# Patient Record
Sex: Female | Born: 1985 | Race: Black or African American | Hispanic: No | Marital: Single | Smoking: Never smoker
Health system: Southern US, Community
[De-identification: ages and names within clinical notes are randomized; demographics above are authoritative.]

---
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
## 2007-01-29 ENCOUNTER — Ambulatory Visit (HOSPITAL_COMMUNITY): Admission: RE | Admit: 2007-01-29 | Discharge: 2007-01-29 | Payer: Self-pay | Admitting: Obstetrics & Gynecology

## 2007-03-17 ENCOUNTER — Ambulatory Visit (HOSPITAL_COMMUNITY): Admission: RE | Admit: 2007-03-17 | Discharge: 2007-03-17 | Payer: Self-pay | Admitting: Obstetrics & Gynecology

## 2007-07-12 ENCOUNTER — Ambulatory Visit: Payer: Self-pay | Admitting: *Deleted

## 2007-07-12 ENCOUNTER — Inpatient Hospital Stay (HOSPITAL_COMMUNITY): Admission: AD | Admit: 2007-07-12 | Discharge: 2007-07-12 | Payer: Self-pay | Admitting: Obstetrics & Gynecology

## 2007-07-16 ENCOUNTER — Ambulatory Visit (HOSPITAL_COMMUNITY): Admission: RE | Admit: 2007-07-16 | Discharge: 2007-07-16 | Payer: Self-pay | Admitting: Family Medicine

## 2007-08-07 ENCOUNTER — Inpatient Hospital Stay (HOSPITAL_COMMUNITY): Admission: AD | Admit: 2007-08-07 | Discharge: 2007-08-09 | Payer: Self-pay | Admitting: Obstetrics & Gynecology

## 2007-08-07 ENCOUNTER — Ambulatory Visit: Payer: Self-pay | Admitting: Advanced Practice Midwife

## 2007-12-02 ENCOUNTER — Emergency Department (HOSPITAL_COMMUNITY): Admission: EM | Admit: 2007-12-02 | Discharge: 2007-12-02 | Payer: Self-pay | Admitting: Internal Medicine

## 2008-05-23 ENCOUNTER — Emergency Department (HOSPITAL_COMMUNITY): Admission: EM | Admit: 2008-05-23 | Discharge: 2008-05-23 | Payer: Self-pay | Admitting: Emergency Medicine

## 2009-08-18 IMAGING — US US OB COMP LESS 14 WK
1 series · 14 of 19 positions shown · non-contrast
Comparison: none

OBSTETRICAL ULTRASOUND:
 This ultrasound was performed in The [HOSPITAL], and the [facility] report will be stored to [REDACTED] PACS.

[Series 1: us ob comp less 14 wk · 14 of 19 slices shown]
[im 1/19]
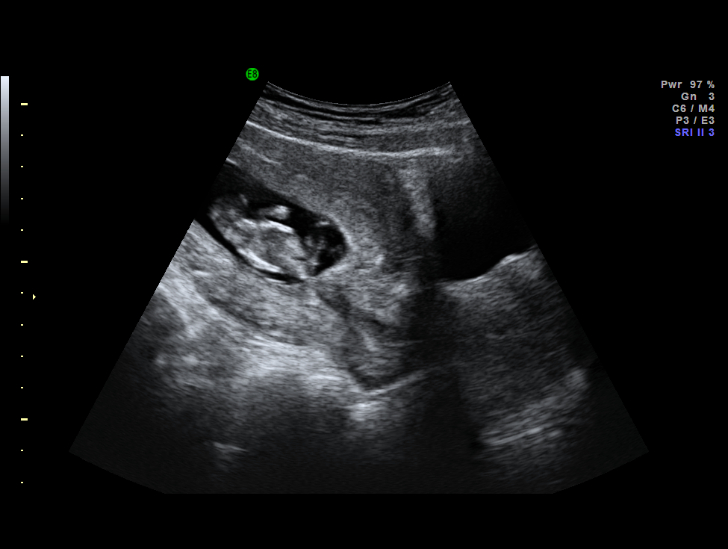
[im 3/19]
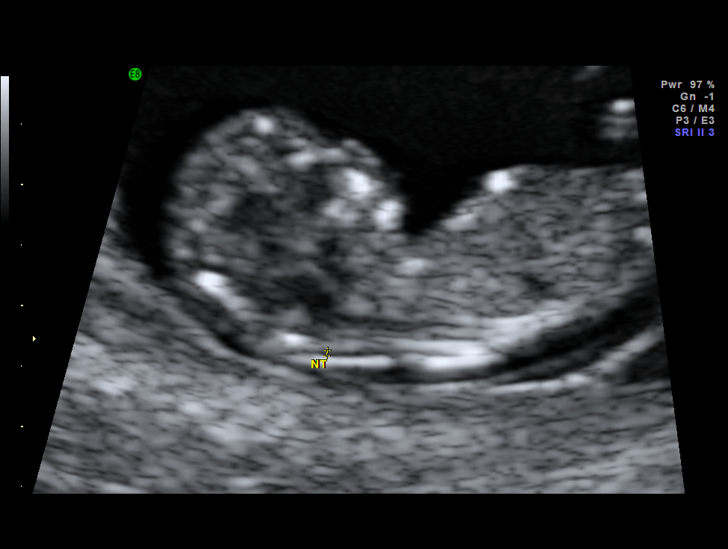
[im 4/19]
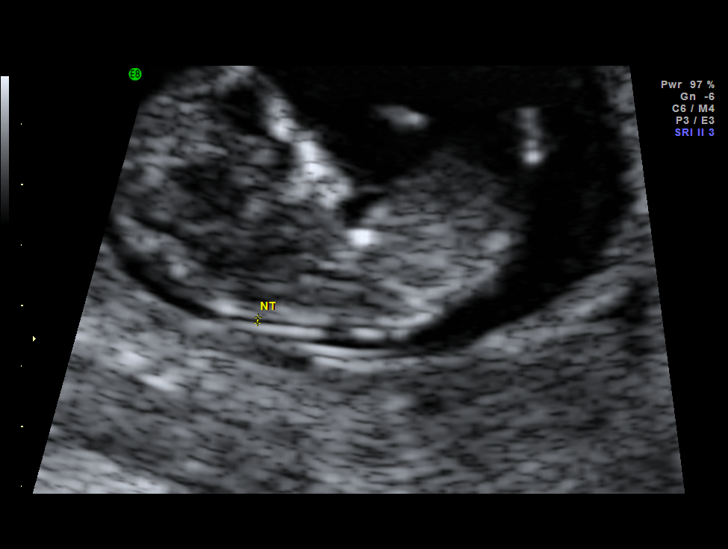
[im 5/19]
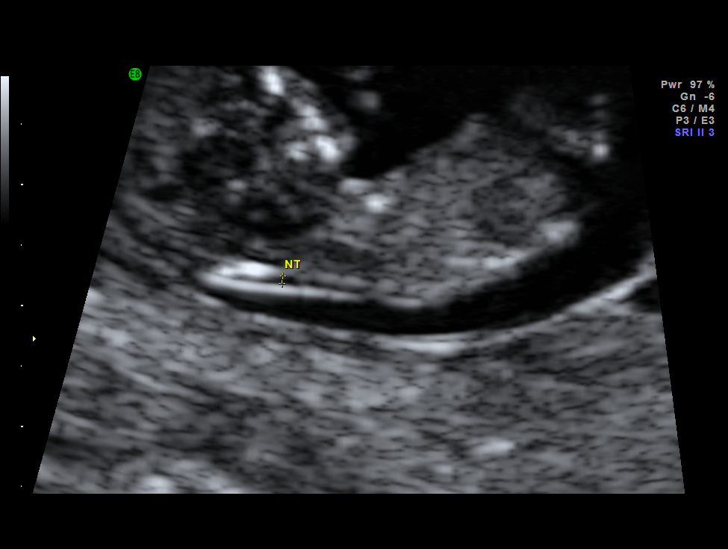
[im 7/19]
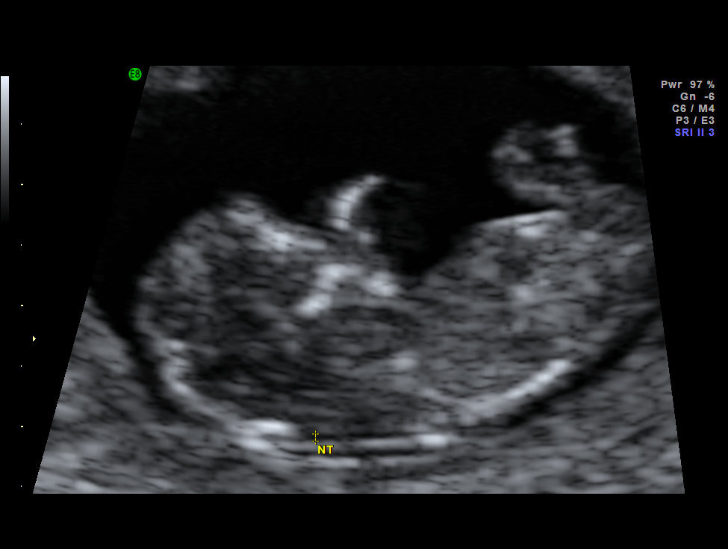
[im 8/19]
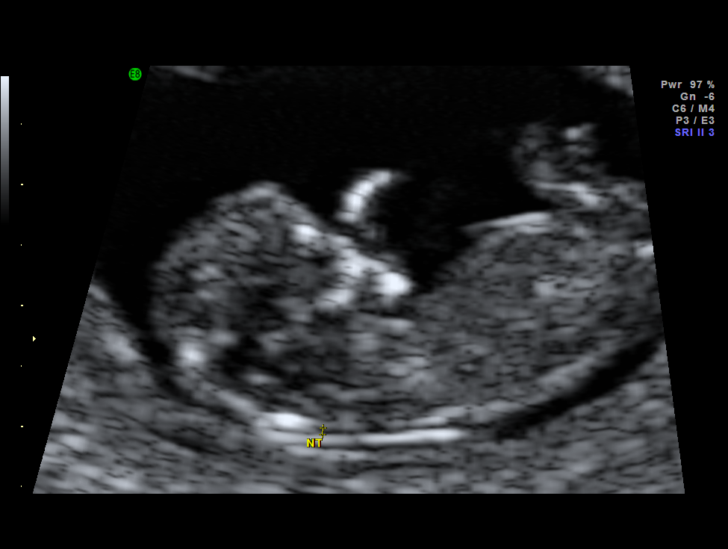
[im 9/19]
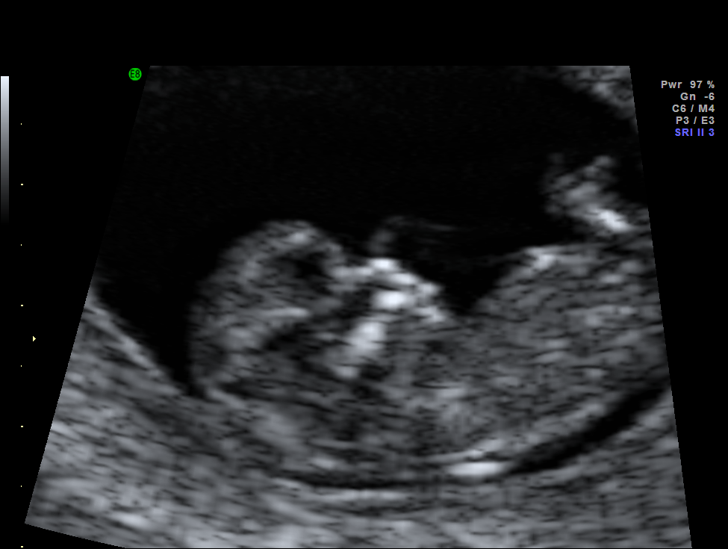
[im 11/19]
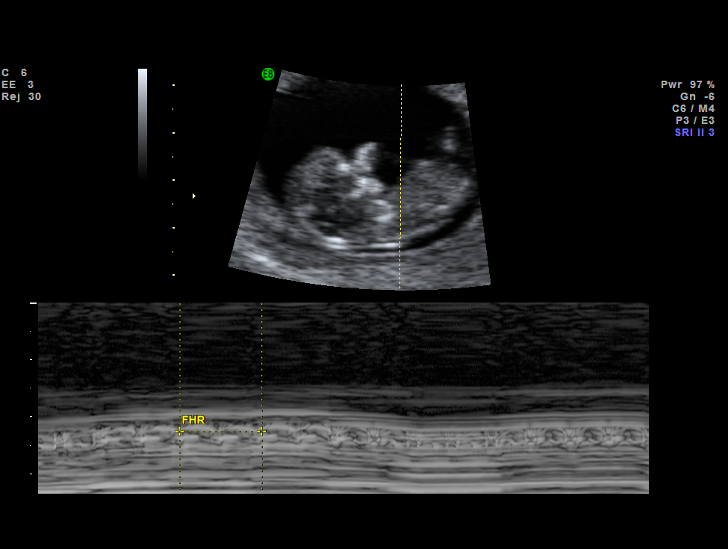
[im 12/19]
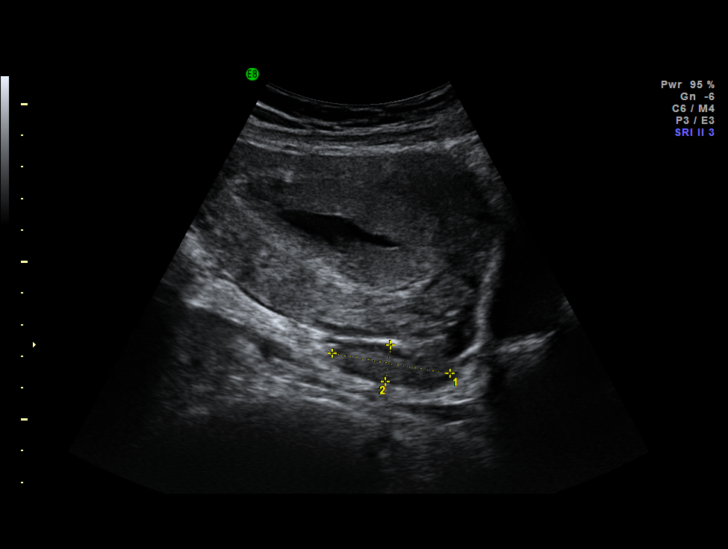
[im 13/19]
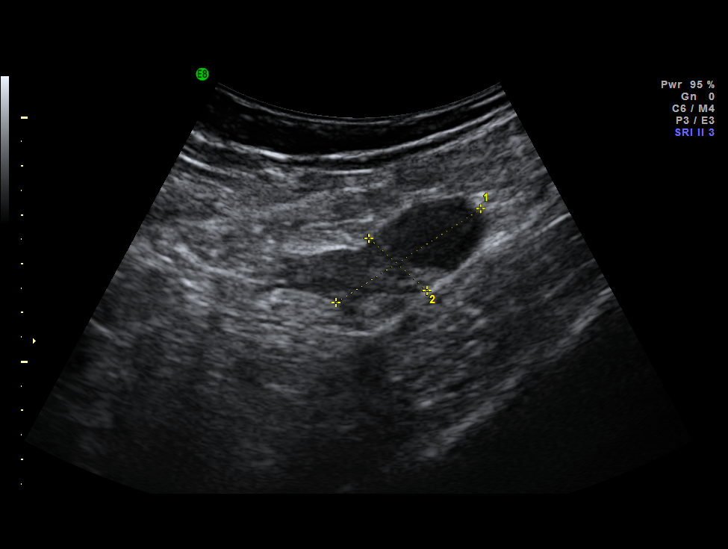
[im 15/19]
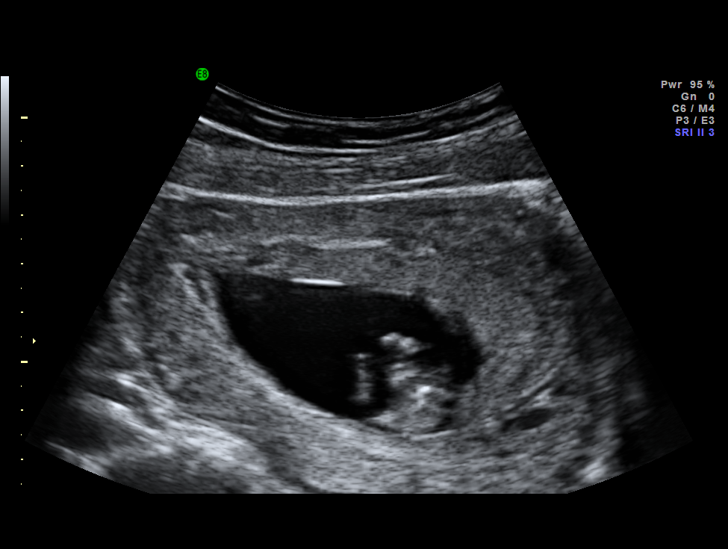
[im 16/19]
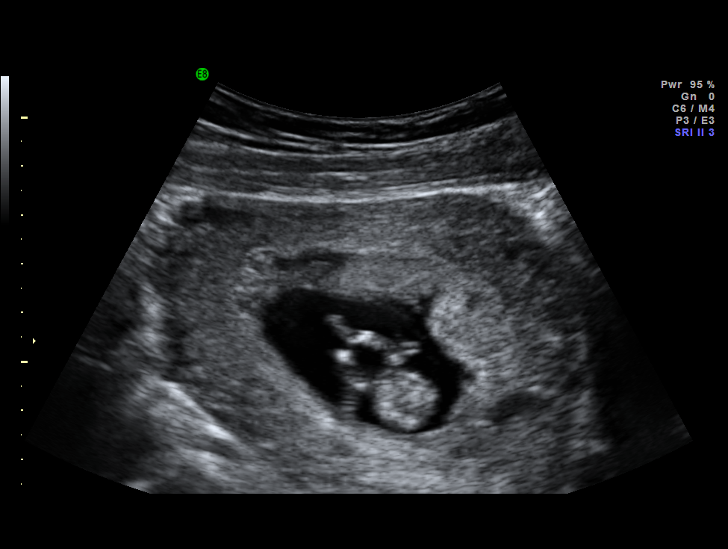
[im 17/19]
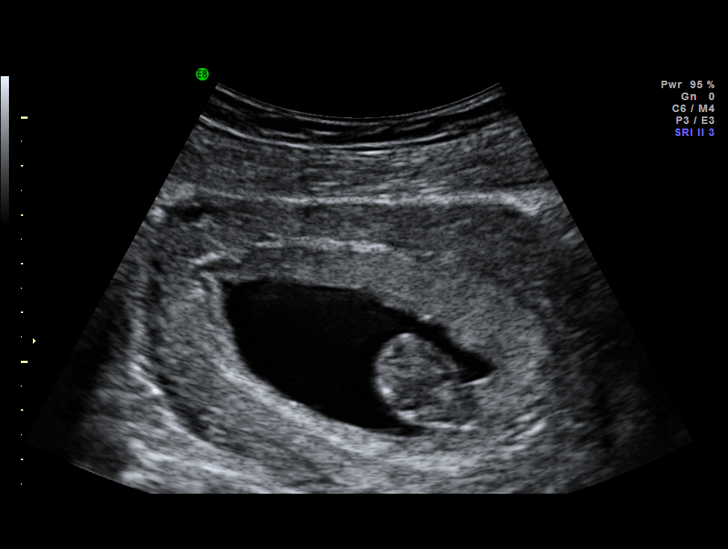
[im 19/19]
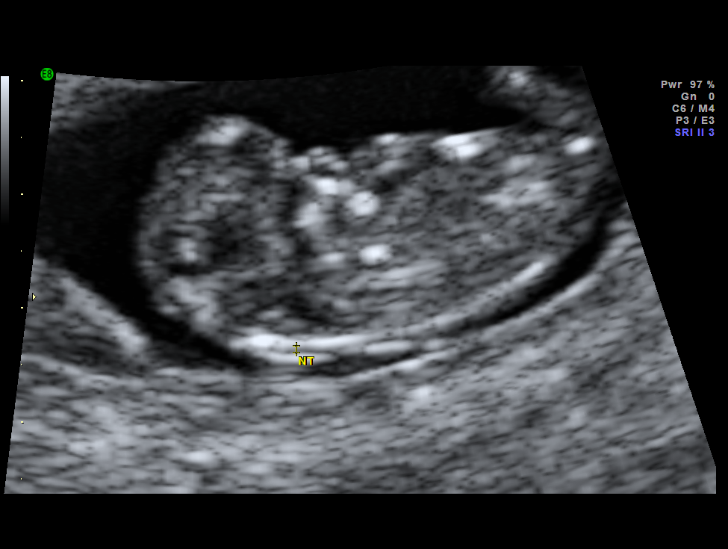

[14 of 19 positions shown; findings below may reference images not displayed]

IMPRESSION: The [facility] report has also been faxed to the ordering physician.

## 2009-10-04 IMAGING — US US OB COMP +14 WK
2 series · 14 of 28 positions shown · non-contrast
Comparison: none

OBSTETRICAL ULTRASOUND:

 This ultrasound exam was performed in the [HOSPITAL] Ultrasound Department.  The OB US report was generated in the [facility] system, and faxed to the ordering physician.  This report is also available in [REDACTED] PACS.

[Series 1: us ob comp +14 wk · 11 of 50 slices shown (1 of 2)]
[im 3/50]
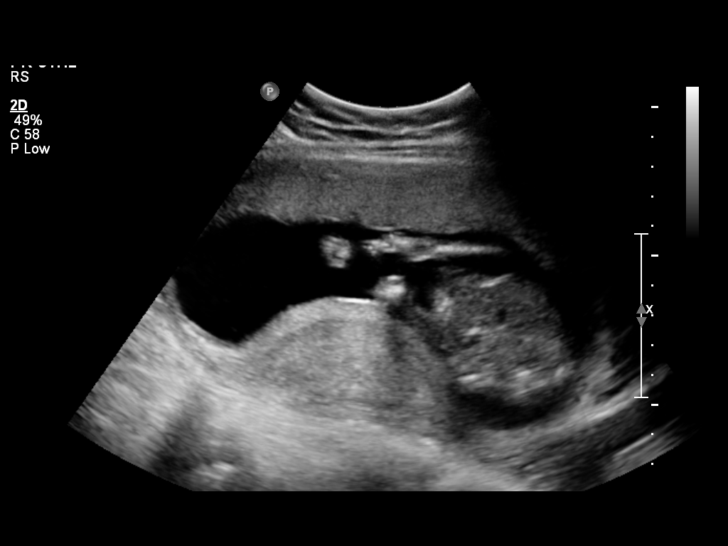
[im 7/50]
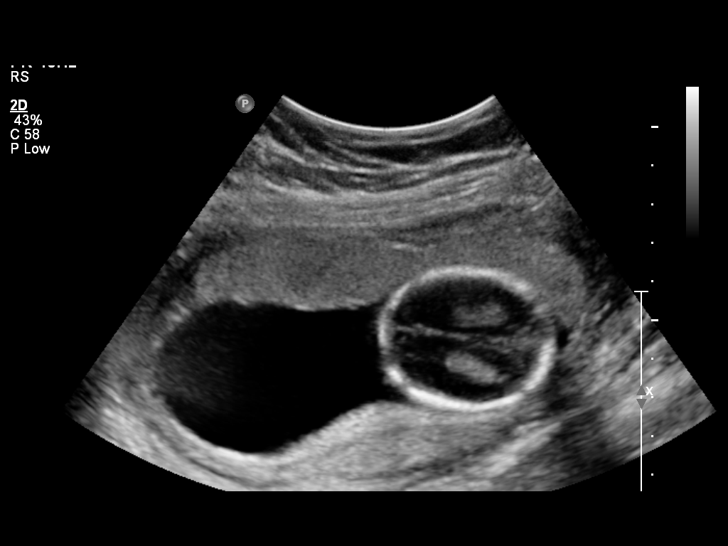
[im 12/50]
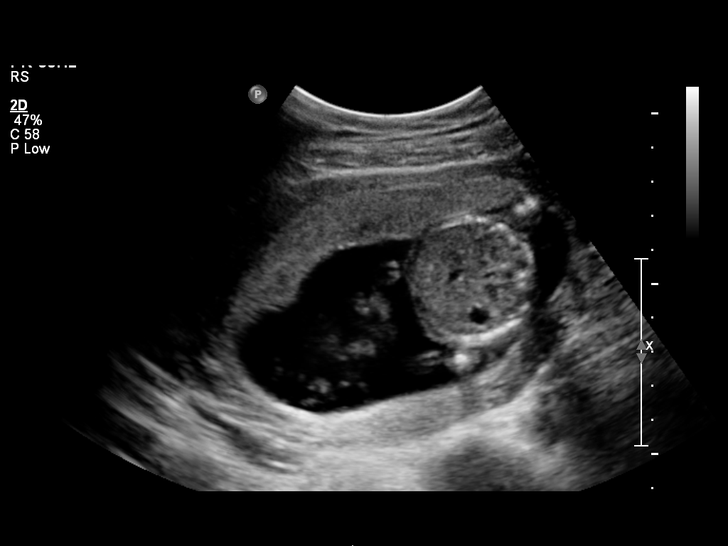
[im 16/50]
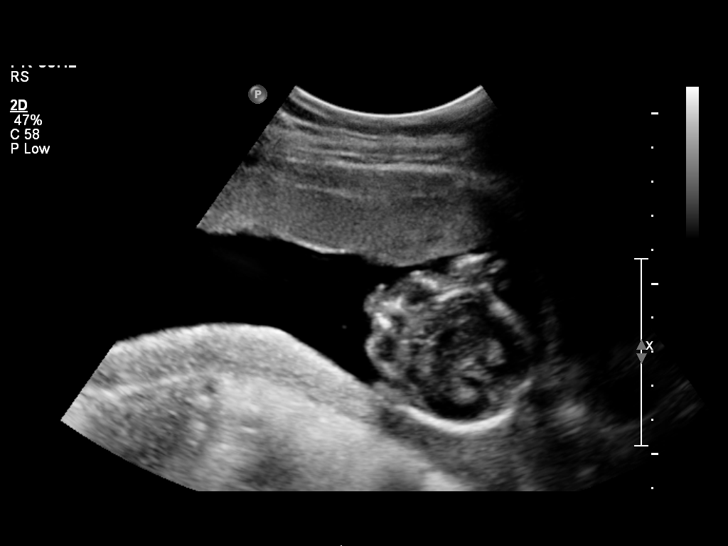
[im 21/50]
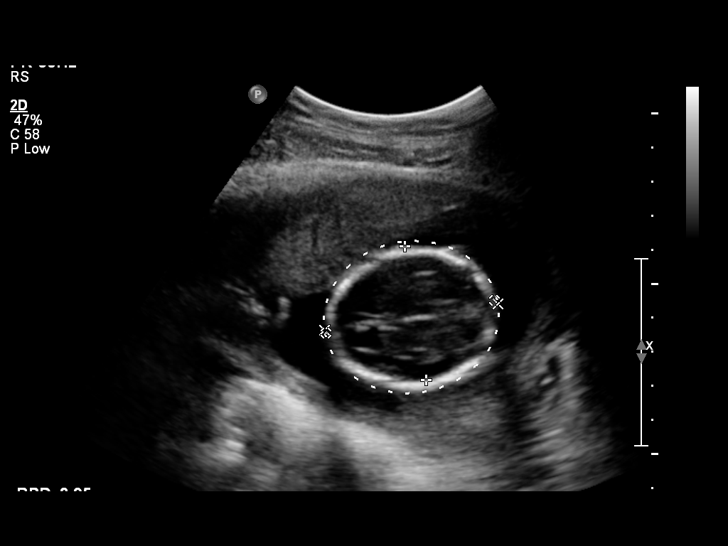
[im 25/50]
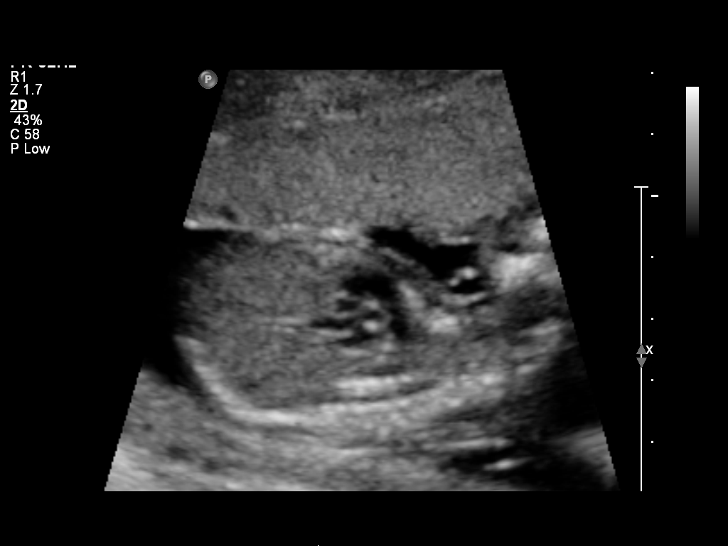
[im 29/50]
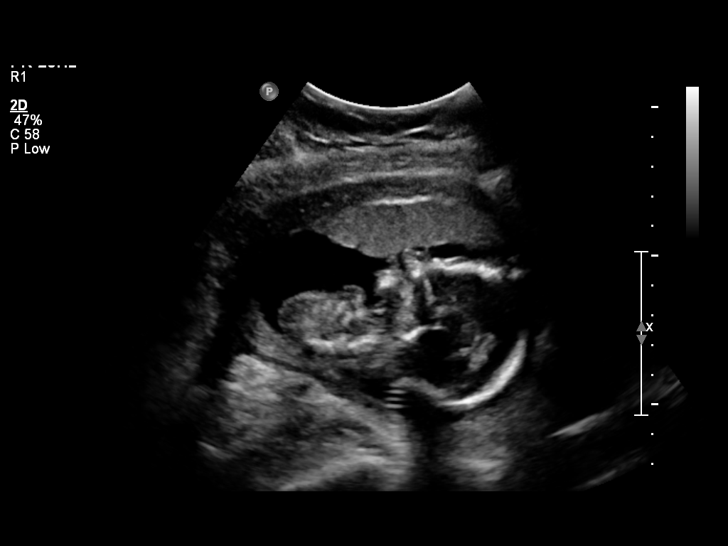
[im 34/50]
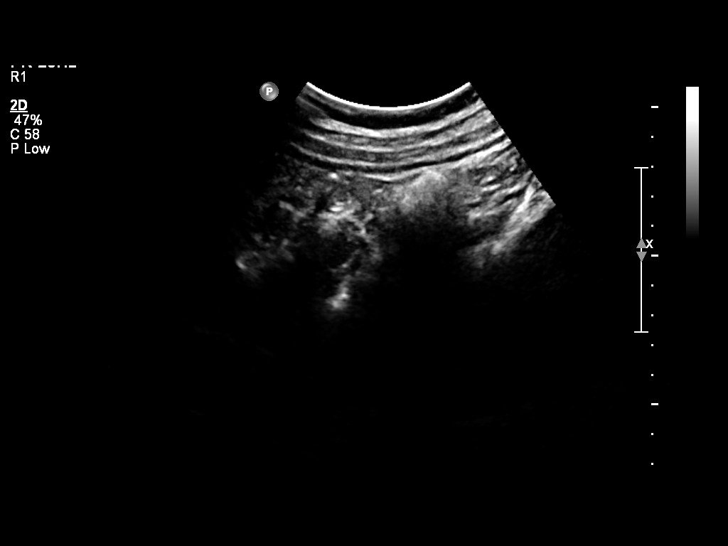
[im 38/50]
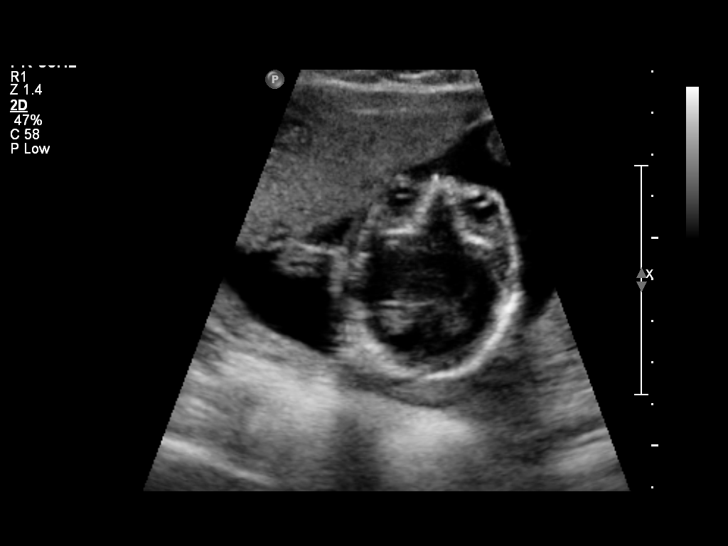
[im 43/50]
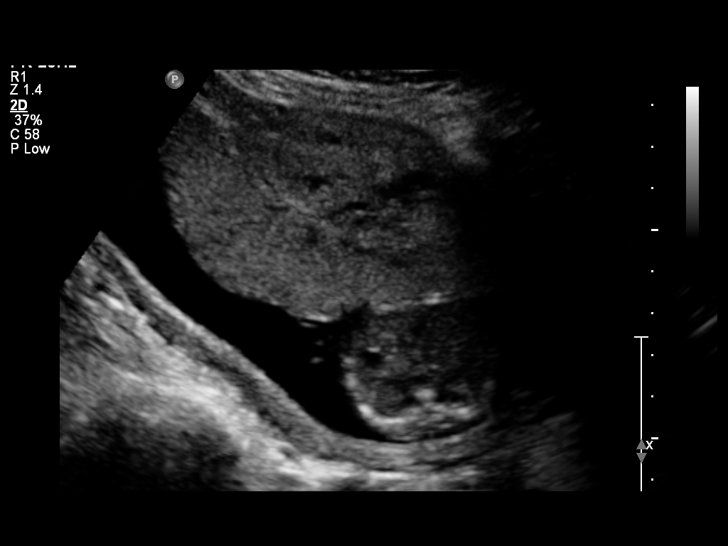
[im 47/50]
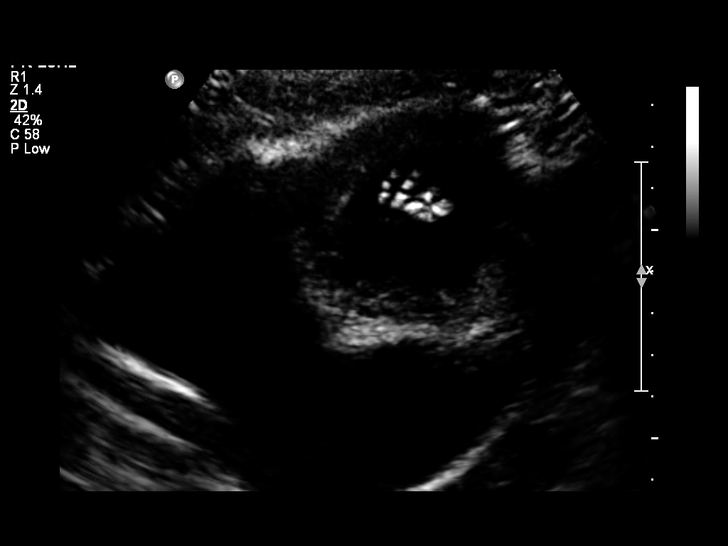

[Series 1: us ob comp +14 wk · 3 of 10 slices shown (2 of 2)]
[im 1/10]
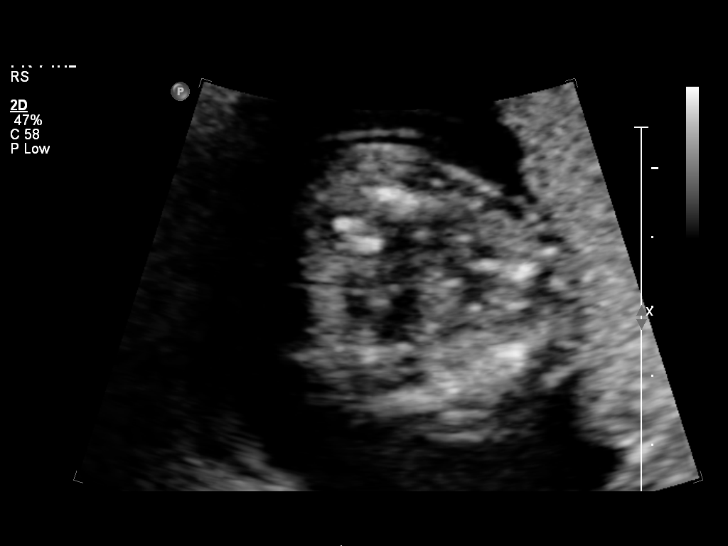
[im 5/10]
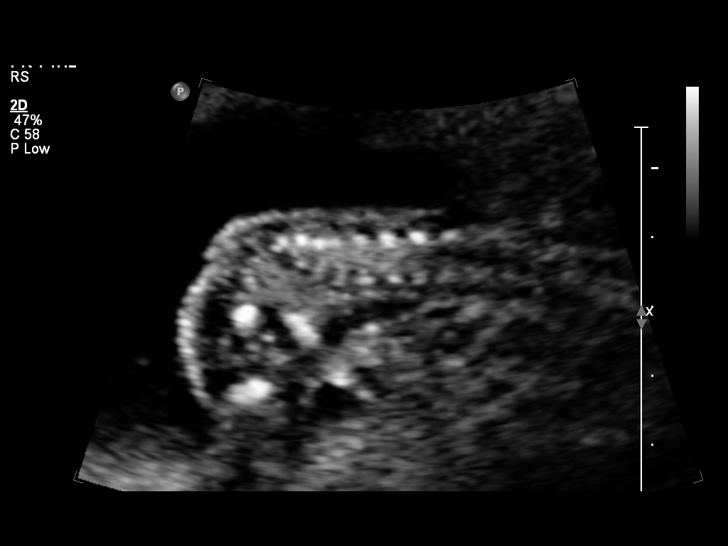
[im 10/10]
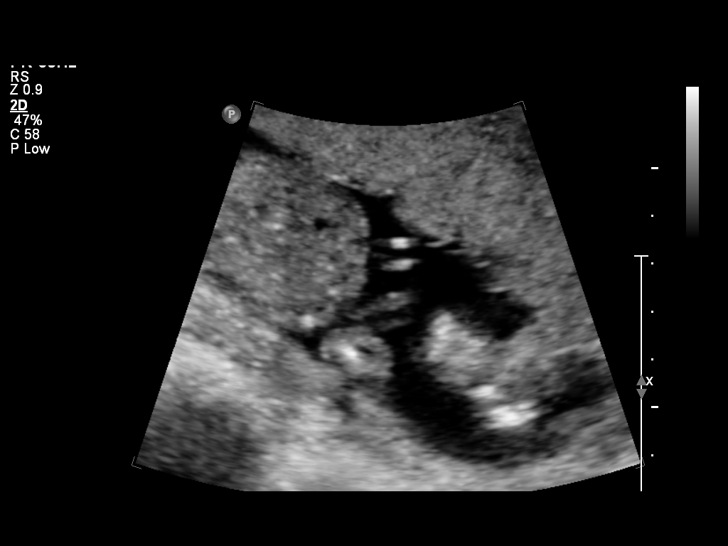

[14 of 28 positions shown; findings below may reference images not displayed]

IMPRESSION: See [facility] report.

## 2010-02-15 ENCOUNTER — Ambulatory Visit
Admission: RE | Admit: 2010-02-15 | Discharge: 2010-02-15 | Payer: Self-pay | Source: Home / Self Care | Attending: Obstetrics & Gynecology | Admitting: Obstetrics & Gynecology

## 2010-03-01 ENCOUNTER — Ambulatory Visit: Admit: 2010-03-01 | Payer: Self-pay | Admitting: Obstetrics & Gynecology

## 2010-03-16 ENCOUNTER — Ambulatory Visit: Admit: 2010-03-16 | Payer: Self-pay | Admitting: Obstetrics and Gynecology

## 2010-03-16 ENCOUNTER — Ambulatory Visit: Payer: Self-pay | Admitting: Obstetrics and Gynecology

## 2010-04-19 ENCOUNTER — Ambulatory Visit: Payer: Self-pay | Admitting: Family Medicine

## 2010-05-03 ENCOUNTER — Ambulatory Visit: Payer: Self-pay | Admitting: Obstetrics & Gynecology

## 2010-06-14 ENCOUNTER — Ambulatory Visit: Payer: Self-pay | Admitting: Family Medicine
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
## 2010-07-23 ENCOUNTER — Emergency Department (HOSPITAL_COMMUNITY)
Admission: EM | Admit: 2010-07-23 | Discharge: 2010-07-24 | Discharge status: Against Medical Advice | Payer: Medicaid Other | Attending: Emergency Medicine | Admitting: Emergency Medicine

## 2010-07-23 DIAGNOSIS — R55 Syncope and collapse: Secondary | ICD-10-CM | POA: Insufficient documentation

## 2010-11-08 LAB — WET PREP, GENITAL
Clue Cells Wet Prep HPF POC: NONE SEEN
Trich, Wet Prep: NONE SEEN
Yeast Wet Prep HPF POC: NONE SEEN

## 2010-11-08 LAB — URINALYSIS, ROUTINE W REFLEX MICROSCOPIC
Bilirubin Urine: NEGATIVE
Nitrite: NEGATIVE
Protein, ur: NEGATIVE
Specific Gravity, Urine: 1.015
Urobilinogen, UA: 0.2

## 2010-11-08 LAB — URINE CULTURE: Colony Count: >=100000

## 2010-11-08 LAB — URINE MICROSCOPIC-ADD ON

## 2010-11-09 LAB — CBC
HCT: 30.5 — ABNORMAL LOW
Hemoglobin: 10.5 — ABNORMAL LOW
MCHC: 34.4
MCV: 86.6
Platelets: 200
RBC: 3.52 — ABNORMAL LOW
RDW: 16.6 — ABNORMAL HIGH
WBC: 8.5

## 2010-11-09 LAB — RPR: RPR Ser Ql: NONREACTIVE

## 2010-11-22 LAB — POCT URINALYSIS DIP (DEVICE)
Bilirubin Urine: NEGATIVE
Glucose, UA: NEGATIVE
Hgb urine dipstick: NEGATIVE
Nitrite: NEGATIVE
Protein, ur: NEGATIVE
Specific Gravity, Urine: 1.02
Urobilinogen, UA: 1
pH: 7.5

## 2010-11-22 LAB — GC/CHLAMYDIA PROBE AMP, GENITAL: Chlamydia, DNA Probe: NEGATIVE

## 2010-11-22 LAB — WET PREP, GENITAL: Yeast Wet Prep HPF POC: NONE SEEN

## 2014-05-21 ENCOUNTER — Emergency Department (HOSPITAL_BASED_OUTPATIENT_CLINIC_OR_DEPARTMENT_OTHER)
Admission: EM | Admit: 2014-05-21 | Discharge: 2014-05-22 | Disposition: A | Discharge status: Home / Self Care | Payer: Medicaid Other | Attending: Emergency Medicine | Admitting: Emergency Medicine

## 2014-05-21 ENCOUNTER — Encounter (HOSPITAL_BASED_OUTPATIENT_CLINIC_OR_DEPARTMENT_OTHER): Payer: Self-pay | Admitting: *Deleted

## 2014-05-21 DIAGNOSIS — B9689 Other specified bacterial agents as the cause of diseases classified elsewhere: Secondary | ICD-10-CM

## 2014-05-21 DIAGNOSIS — K5904 Chronic idiopathic constipation: Secondary | ICD-10-CM

## 2014-05-21 DIAGNOSIS — K5909 Other constipation: Secondary | ICD-10-CM | POA: Insufficient documentation

## 2014-05-21 DIAGNOSIS — Z3202 Encounter for pregnancy test, result negative: Secondary | ICD-10-CM | POA: Insufficient documentation

## 2014-05-21 DIAGNOSIS — N760 Acute vaginitis: Secondary | ICD-10-CM | POA: Insufficient documentation

## 2014-05-21 LAB — CBG MONITORING, ED: Glucose-Capillary: 109 mg/dL — ABNORMAL HIGH (ref 70–99)

## 2014-05-21 NOTE — ED Notes (Signed)
Pt c/o ;URi symptoms and increased urination

## 2014-05-21 NOTE — ED Provider Notes (Signed)
CSN: [id number]     Arrival date & time 05/21/14  2334 History  This chart was scribed for Delena Casebeer, MD by Elveria Risingimelie Horne, ED scribe.  This patient was seen in room MH06/MH06 and the patient's care was started at 11:45 PM.   Chief Complaint  Patient presents with  . URI  . Urinary Frequency   Patient is a 29 y.o. female presenting with dysuria. The history is provided by the patient. No language interpreter was used.  Dysuria Pain quality:  Aching Pain severity:  Moderate Onset quality:  Gradual Timing:  Constant Progression:  Unchanged Chronicity:  New Recent urinary tract infections: no   Relieved by:  Nothing Worsened by:  Nothing tried Ineffective treatments:  None tried Urinary symptoms: no foul-smelling urine   Associated symptoms: vaginal discharge   Associated symptoms: no fever, no nausea and no vomiting   Risk factors: no urinary catheter    HPI Comments: Tina Frazier is a 29 y.o. female who presents to the Emergency Department complaining of urinary frequency. Patient reports burning sensation after urinating and abdominal pain. Patient reports decreased appetite and cold symptoms recently.  Patient reports fever a few days ago; she afebrile in ED.  LMP 05/01/2014  History reviewed. No pertinent past medical history. History reviewed. No pertinent past surgical history. History reviewed. No pertinent family history. History  Substance Use Topics  . Smoking status: Never Smoker   . Smokeless tobacco: Not on file  . Alcohol Use: No   OB History    No data available     Review of Systems  Constitutional: Negative for fever and chills.  Respiratory: Negative for shortness of breath and wheezing.   Gastrointestinal: Positive for constipation. Negative for nausea, vomiting and diarrhea.  Genitourinary: Positive for dysuria, frequency and vaginal discharge.  All other systems reviewed and are negative.   Allergies  Review of patient's allergies indicates no  known allergies.  Home Medications   Prior to Admission medications   Not on File   Triage Vitals: BP 133/86 mmHg  Pulse 92  Temp(Src) 98.2 F (36.8 C) (Oral)  Ht 5\' 9"  (1.753 m)  Wt 185 lb (83.915 kg)  BMI 27.31 kg/m2  SpO2 100%  LMP 05/01/2014 Physical Exam  Constitutional: She is oriented to person, place, and time. She appears well-developed and well-nourished. No distress.  HENT:  Head: Normocephalic and atraumatic.  Mouth/Throat: Oropharynx is clear and moist. No oropharyngeal exudate.  Eyes: EOM are normal. Pupils are equal, round, and reactive to light.  Neck: Normal range of motion. Neck supple. No tracheal deviation present.  Cardiovascular: Normal rate, regular rhythm and normal heart sounds.   Pulmonary/Chest: Effort normal and breath sounds normal. No respiratory distress. She has no wheezes. She has no rales. She exhibits no tenderness.  Abdominal: Soft. Bowel sounds are normal. She exhibits no mass. There is no tenderness. There is no rebound and no guarding.  Hard stool throughout colon.   Genitourinary: Vaginal discharge found.  Scant white discharge, chaperone present no CMT or adnexal tenderness  Musculoskeletal: Normal range of motion.  Neurological: She is alert and oriented to person, place, and time. She has normal reflexes.  Skin: Skin is warm and dry.  Psychiatric: She has a normal mood and affect. Her behavior is normal.  Nursing note and vitals reviewed.   ED Course  Procedures (including critical care time)  COORDINATION OF CARE: 11:54 PM- Awaiting urine specimen. Discussed treatment plan with patient at bedside and patient agreed to  plan.   Labs Review Labs Reviewed  CBG MONITORING, ED - Abnormal; Notable for the following:    Glucose-Capillary 109 (*)    All other components within normal limits  URINALYSIS, ROUTINE W REFLEX MICROSCOPIC  PREGNANCY, URINE    Imaging Review No results found.   EKG Interpretation None      MDM    Final diagnoses:  None    Will treat for BV and constipation with miralax.  Close follow up with your PMD  I personally performed the services described in this documentation, which was scribed in my presence. The recorded information has been reviewed and is accurate.    Cy Blamer, MD 05/22/14 [phone]

## 2014-05-22 LAB — URINALYSIS, ROUTINE W REFLEX MICROSCOPIC
BILIRUBIN URINE: NEGATIVE
GLUCOSE, UA: NEGATIVE mg/dL
Hgb urine dipstick: NEGATIVE
KETONES UR: NEGATIVE mg/dL
LEUKOCYTES UA: NEGATIVE
Nitrite: NEGATIVE
PH: 7 (ref 5.0–8.0)
PROTEIN: NEGATIVE mg/dL
SPECIFIC GRAVITY, URINE: 1.025 (ref 1.005–1.030)
Urobilinogen, UA: 1 mg/dL (ref 0.0–1.0)

## 2014-05-22 LAB — PREGNANCY, URINE: PREG TEST UR: NEGATIVE

## 2014-05-22 LAB — WET PREP, GENITAL
TRICH WET PREP: NONE SEEN
Yeast Wet Prep HPF POC: NONE SEEN

## 2014-05-22 MED ORDER — METRONIDAZOLE 500 MG PO TABS: 500.0000 mg | ORAL_TABLET | Freq: Two times a day (BID) | ORAL | Status: AC

## 2014-05-22 MED ORDER — METRONIDAZOLE 500 MG PO TABS
500.0000 mg | ORAL_TABLET | Freq: Once | ORAL | Status: AC
  Administered 2014-05-22: 500 mg via ORAL
  Filled 2014-05-22: qty 1

## 2014-05-22 NOTE — Discharge Instructions (Signed)
Bacterial Vaginosis Bacterial vaginosis is a vaginal infection that occurs when the normal balance of bacteria in the vagina is disrupted. It results from an overgrowth of certain bacteria. This is the most common vaginal infection in women of childbearing age. Treatment is important to prevent complications, especially in pregnant women, as it can cause a premature delivery. CAUSES  Bacterial vaginosis is caused by an increase in harmful bacteria that are normally present in smaller amounts in the vagina. Several different kinds of bacteria can cause bacterial vaginosis. However, the reason that the condition develops is not fully understood. RISK FACTORS Certain activities or behaviors can put you at an increased risk of developing bacterial vaginosis, including:  Having a new sex partner or multiple sex partners.  Douching.  Using an intrauterine device (IUD) for contraception. Women do not get bacterial vaginosis from toilet seats, bedding, swimming pools, or contact with objects around them. SIGNS AND SYMPTOMS  Some women with bacterial vaginosis have no signs or symptoms. Common symptoms include:  Grey vaginal discharge.  A fishlike odor with discharge, especially after sexual intercourse.  Itching or burning of the vagina and vulva.  Burning or pain with urination. DIAGNOSIS  Your health care provider will take a medical history and examine the vagina for signs of bacterial vaginosis. A sample of vaginal fluid may be taken. Your health care provider will look at this sample under a microscope to check for bacteria and abnormal cells. A vaginal pH test may also be done.  TREATMENT  Bacterial vaginosis may be treated with antibiotic medicines. These may be given in the form of a pill or a vaginal cream. A second round of antibiotics may be prescribed if the condition comes back after treatment.  HOME CARE INSTRUCTIONS   Only take over-the-counter or prescription medicines as  directed by your health care provider.  If antibiotic medicine was prescribed, take it as directed. Make sure you finish it even if you start to feel better.  Do not have sex until treatment is completed.  Tell all sexual partners that you have a vaginal infection. They should see their health care provider and be treated if they have problems, such as a mild rash or itching.  Practice safe sex by using condoms and only having one sex partner. SEEK MEDICAL CARE IF:   Your symptoms are not improving after 3 days of treatment.  You have increased discharge or pain.  You have a fever. MAKE SURE YOU:   Understand these instructions.  Will watch your condition.  Will get help right away if you are not doing well or get worse. FOR MORE INFORMATION  Centers for Disease Control and Prevention, Division of STD Prevention: www.cdc.gov/std American Sexual Health Association (ASHA): www.ashastd.org  Document Released: 01/29/2005 Document Revised: 11/19/2012 Document Reviewed: 09/10/2012 Exit[employer] Patient Information 2015 [employer]. This information is not intended to replace advice given to you by your health care provider. Make sure you discuss any questions you have with your health care provider.  

## 2014-05-24 LAB — GC/CHLAMYDIA PROBE AMP (~~LOC~~) NOT AT ARMC
Chlamydia: NEGATIVE
Neisseria Gonorrhea: NEGATIVE

## 2014-09-30 ENCOUNTER — Emergency Department (INDEPENDENT_AMBULATORY_CARE_PROVIDER_SITE_OTHER)
Admission: EM | Admit: 2014-09-30 | Discharge: 2014-09-30 | Disposition: A | Discharge status: Home / Self Care | Payer: Self-pay | Source: Home / Self Care | Attending: Family Medicine | Admitting: Family Medicine

## 2014-09-30 ENCOUNTER — Encounter (HOSPITAL_COMMUNITY): Payer: Self-pay | Admitting: Emergency Medicine

## 2014-09-30 DIAGNOSIS — R42 Dizziness and giddiness: Secondary | ICD-10-CM

## 2014-09-30 DIAGNOSIS — R5383 Other fatigue: Secondary | ICD-10-CM

## 2014-09-30 DIAGNOSIS — R531 Weakness: Secondary | ICD-10-CM

## 2014-09-30 LAB — POCT I-STAT, CHEM 8
BUN: 11 mg/dL (ref 6–20)
Calcium, Ion: 1.22 mmol/L (ref 1.12–1.23)
Chloride: 102 mmol/L (ref 101–111)
Creatinine, Ser: 0.7 mg/dL (ref 0.44–1.00)
Glucose, Bld: 84 mg/dL (ref 65–99)
HEMATOCRIT: 37 % (ref 36.0–46.0)
HEMOGLOBIN: 12.6 g/dL (ref 12.0–15.0)
POTASSIUM: 3.9 mmol/L (ref 3.5–5.1)
Sodium: 139 mmol/L (ref 135–145)
TCO2: 26 mmol/L (ref 0–100)

## 2014-09-30 LAB — POCT PREGNANCY, URINE: PREG TEST UR: NEGATIVE

## 2014-09-30 LAB — POCT URINALYSIS DIP (DEVICE)
BILIRUBIN URINE: NEGATIVE
GLUCOSE, UA: NEGATIVE mg/dL
Ketones, ur: NEGATIVE mg/dL
Leukocytes, UA: NEGATIVE
NITRITE: NEGATIVE
Protein, ur: NEGATIVE mg/dL
Specific Gravity, Urine: 1.025 (ref 1.005–1.030)
Urobilinogen, UA: 0.2 mg/dL (ref 0.0–1.0)
pH: 5.5 (ref 5.0–8.0)

## 2014-09-30 NOTE — ED Provider Notes (Signed)
CSN: [id number]     Arrival date & time 09/30/14  1310 History   First MD Initiated Contact with Patient 09/30/14 1452     Chief Complaint  Patient presents with  . Anemia   (Consider location/radiation/quality/duration/timing/severity/associated sxs/prior Treatment) HPI Comments: 29 year old female is complaining of a three-week history of intermittent dizziness, generalized weakness, feeling tired and headache. She is under the assumption that she may have anemia based on an MRI of the lower back. This MRI interpretation reveals that there are changes in bone marrow echogenicity and one reason that may cause this appearance is anemia. The patient denies any known bleeding.  Patient is a 29 y.o. female presenting with weakness and dizziness. The history is provided by the patient.  Weakness This is a new problem. The current episode started more than 1 week ago. The problem occurs constantly. The problem has not changed since onset.Associated symptoms include headaches. Pertinent negatives include no chest pain, no abdominal pain and no shortness of breath. Nothing aggravates the symptoms. Nothing relieves the symptoms. She has tried nothing for the symptoms. The treatment provided no relief.  Dizziness Quality:  Imbalance and lightheadedness Severity:  Mild Onset quality:  Gradual Duration:  3 weeks Timing:  Intermittent Progression:  Waxing and waning Context: bending over   Context: not with ear pain, not with loss of consciousness, not with medication, not with physical activity and not when urinating   Relieved by:  Being still Worsened by:  Movement Ineffective treatments:  None tried Associated symptoms: headaches and weakness   Associated symptoms: no chest pain, no hearing loss, no palpitations and no shortness of breath   Risk factors: anemia     History reviewed. No pertinent past medical history. History reviewed. No pertinent past surgical history. History reviewed. No  pertinent family history. Social History  Substance Use Topics  . Smoking status: Never Smoker   . Smokeless tobacco: None  . Alcohol Use: No   OB History    No data available     Review of Systems  Constitutional: Positive for activity change and fatigue. Negative for fever and chills.  HENT: Negative for congestion, ear discharge, ear pain, hearing loss, postnasal drip, rhinorrhea and sore throat.   Eyes: Negative.   Respiratory: Negative for cough, chest tightness and shortness of breath.   Cardiovascular: Negative for chest pain, palpitations and leg swelling.  Gastrointestinal: Negative.  Negative for abdominal pain.  Genitourinary: Positive for urgency. Negative for dysuria, frequency, flank pain, vaginal bleeding, vaginal discharge, vaginal pain and pelvic pain.  Musculoskeletal: Negative.   Neurological: Positive for dizziness, weakness and headaches.  Psychiatric/Behavioral: Negative.     Allergies  Review of patient's allergies indicates no known allergies.  Home Medications   Prior to Admission medications   Medication Sig Start Date End Date Taking? Authorizing Provider  metroNIDAZOLE (FLAGYL) 500 MG tablet Take 1 tablet (500 mg total) by mouth 2 (two) times daily. One po bid x 7 days 05/22/14   April Palumbo, MD   BP 108/71 mmHg  Pulse 73  Temp(Src) 98.4 F (36.9 C) (Oral)  Resp 18  SpO2 100% Physical Exam  Constitutional: She is oriented to person, place, and time. She appears well-developed and well-nourished. No distress.  HENT:  Mouth/Throat: Oropharynx is clear and moist. No oropharyngeal exudate.  Bilateral TMs are normal  Eyes: Conjunctivae and EOM are normal. Pupils are equal, round, and reactive to light.  Neck: Normal range of motion. Neck supple.  Cardiovascular: Normal rate, regular  rhythm and normal heart sounds.   Pulmonary/Chest: Effort normal and breath sounds normal. No respiratory distress.  Abdominal: Soft. There is no tenderness. There is  no guarding.  Musculoskeletal: She exhibits no edema or tenderness.  Lymphadenopathy:    She has no cervical adenopathy.  Neurological: She is alert and oriented to person, place, and time. She exhibits normal muscle tone.  Skin: Skin is warm and dry. No rash noted.  Psychiatric: She has a normal mood and affect.  Nursing note and vitals reviewed.   ED Course  Procedures (including critical care time) Labs Review Labs Reviewed  POCT URINALYSIS DIP (DEVICE) - Abnormal; Notable for the following:    Hgb urine dipstick TRACE (*)    All other components within normal limits  POCT I-STAT, CHEM 8  POCT PREGNANCY, URINE    Imaging Review No results found.  Results for orders placed or performed during the hospital encounter of 09/30/14  I-STAT, chem 8  Result Value Ref Range   Sodium 139 135 - 145 mmol/L   Potassium 3.9 3.5 - 5.1 mmol/L   Chloride 102 101 - 111 mmol/L   BUN 11 6 - 20 mg/dL   Creatinine, Ser 1.61 0.44 - 1.00 mg/dL   Glucose, Bld 84 65 - 99 mg/dL   Calcium, Ion 0.96 0.45 - 1.23 mmol/L   TCO2 26 0 - 100 mmol/L   Hemoglobin 12.6 12.0 - 15.0 g/dL   HCT 40.9 81.1 - 91.4 %  POCT urinalysis dip (device)  Result Value Ref Range   Glucose, UA NEGATIVE NEGATIVE mg/dL   Bilirubin Urine NEGATIVE NEGATIVE   Ketones, ur NEGATIVE NEGATIVE mg/dL   Specific Gravity, Urine 1.025 1.005 - 1.030   Hgb urine dipstick TRACE (A) NEGATIVE   pH 5.5 5.0 - 8.0   Protein, ur NEGATIVE NEGATIVE mg/dL   Urobilinogen, UA 0.2 0.0 - 1.0 mg/dL   Nitrite NEGATIVE NEGATIVE   Leukocytes, UA NEGATIVE NEGATIVE  Pregnancy, urine POC  Result Value Ref Range   Preg Test, Ur NEGATIVE NEGATIVE    MDM   1. Other fatigue   2. Dizziness   3. Weakness     It is uncertain as to what the cause of beer a weakness and fatigue is. Your lab work is normal. There is no evidence of anemia. Urine specimen is normal. There were no findings on her physical exam to suggest infection or other abnormalities.  Recommended to locate a PCP as soon as possible for evaluation. Look on page one for phone numbers for assisting in obtaining a PCP. For any new symptoms problems or worsening coated emergency department.     Hayden Rasmussen, NP 09/30/14 [phone]

## 2014-09-30 NOTE — ED Notes (Signed)
C/o anemia  States she is dizzy, lightheaded and fatigue  No tx tried  Went to ortho and was dx with anemia

## 2014-09-30 NOTE — Discharge Instructions (Signed)
Dizziness °Dizziness is a common problem. It is a feeling of unsteadiness or light-headedness. You may feel like you are about to faint. Dizziness can lead to injury if you stumble or fall. A person of any age group can suffer from dizziness, but dizziness is more common in older adults. °CAUSES  °Dizziness can be caused by many different things, including: °· Middle ear problems. °· Standing for too long. °· Infections. °· An allergic reaction. °· Aging. °· An emotional response to something, such as the sight of blood. °· Side effects of medicines. °· Tiredness. °· Problems with circulation or blood pressure. °· Excessive use of alcohol or medicines, or illegal drug use. °· Breathing too fast (hyperventilation). °· An irregular heart rhythm (arrhythmia). °· A low red blood cell count (anemia). °· Pregnancy. °· Vomiting, diarrhea, fever, or other illnesses that cause body fluid loss (dehydration). °· Diseases or conditions such as Parkinson's disease, high blood pressure (hypertension), diabetes, and thyroid problems. °· Exposure to extreme heat. °DIAGNOSIS  °Your health care provider will ask about your symptoms, perform a physical exam, and perform an electrocardiogram (ECG) to record the electrical activity of your heart. Your health care provider may also perform other heart or blood tests to determine the cause of your dizziness. These may include: °· Transthoracic echocardiogram (TTE). During echocardiography, sound waves are used to evaluate how blood flows through your heart. °· Transesophageal echocardiogram (TEE). °· Cardiac monitoring. This allows your health care provider to monitor your heart rate and rhythm in real time. °· Holter monitor. This is a portable device that records your heartbeat and can help diagnose heart arrhythmias. It allows your health care provider to track your heart activity for several days if needed. °· Stress tests by exercise or by giving medicine that makes the heart beat  faster. °TREATMENT  °Treatment of dizziness depends on the cause of your symptoms and can vary greatly. °HOME CARE INSTRUCTIONS  °· Drink enough fluids to keep your urine clear or pale yellow. This is especially important in very hot weather. In older adults, it is also important in cold weather. °· Take your medicine exactly as directed if your dizziness is caused by medicines. When taking blood pressure medicines, it is especially important to get up slowly. °· Rise slowly from chairs and steady yourself until you feel okay. °· In the morning, first sit up on the side of the bed. When you feel okay, stand slowly while holding onto something until you know your balance is fine. °· Move your legs often if you need to stand in one place for a long time. Tighten and relax your muscles in your legs while standing. °· Have someone stay with you for 1-2 days if dizziness continues to be a problem. Do this until you feel you are well enough to stay alone. Have the person call your health care provider if he or she notices changes in you that are concerning. °· Do not drive or use heavy machinery if you feel dizzy. °· Do not drink alcohol. °SEEK IMMEDIATE MEDICAL CARE IF:  °· Your dizziness or light-headedness gets worse. °· You feel nauseous or vomit. °· You have problems talking, walking, or using your arms, hands, or legs. °· You feel weak. °· You are not thinking clearly or you have trouble forming sentences. It may take a friend or family member to notice this. °· You have chest pain, abdominal pain, shortness of breath, or sweating. °· Your vision changes. °· You notice   any bleeding.  You have side effects from medicine that seems to be getting worse rather than better. MAKE SURE YOU:   Understand these instructions.  Will watch your condition.  Will get help right away if you are not doing well or get worse. Document Released: 07/25/2000 Document Revised: 02/03/2013 Document Reviewed: 08/18/2010 [facility]  Patient Information 2015 [place], [place]. This information is not intended to replace advice given to you by your health care provider. Make sure you discuss any questions you have with your health care provider.  Fatigue It is uncertain as to what the cause of beer a weakness and fatigue is. Your lab work is normal. There is no evidence of anemia. Urine specimen is normal. There were no findings on her physical exam to suggest infection or other abnormalities. Recommended to locate a PCP as soon as possible for evaluation. Look on page one for phone numbers for assisting in obtaining a PCP. For any new symptoms problems or worsening coated emergency department. Fatigue is a feeling of tiredness, lack of energy, lack of motivation, or feeling tired all the time. Having enough rest, good nutrition, and reducing stress will normally reduce fatigue. Consult your caregiver if it persists. The nature of your fatigue will help your caregiver to find out its cause. The treatment is based on the cause.  CAUSES  There are many causes for fatigue. Most of the time, fatigue can be traced to one or more of your habits or routines. Most causes fit into one or more of three general areas. They are: Lifestyle problems  Sleep disturbances.  Overwork.  Physical exertion.  Unhealthy habits.  Poor eating habits or eating disorders.  Alcohol and/or drug use .  Lack of proper nutrition (malnutrition). Psychological problems  Stress and/or anxiety problems.  Depression.  Grief.  Boredom. Medical Problems or Conditions  Anemia.  Pregnancy.  Thyroid gland problems.  Recovery from major surgery.  Continuous pain.  Emphysema or asthma that is not well controlled  Allergic conditions.  Diabetes.  Infections (such as mononucleosis).  Obesity.  Sleep disorders, such as sleep apnea.  Heart failure or other heart-related problems.  Cancer.  Kidney disease.  Liver disease.  Effects of  certain medicines such as antihistamines, cough and cold remedies, prescription pain medicines, heart and blood pressure medicines, drugs used for treatment of cancer, and some antidepressants. SYMPTOMS  The symptoms of fatigue include:   Lack of energy.  Lack of drive (motivation).  Drowsiness.  Feeling of indifference to the surroundings. DIAGNOSIS  The details of how you feel help guide your caregiver in finding out what is causing the fatigue. You will be asked about your present and past health condition. It is important to review all medicines that you take, including prescription and non-prescription items. A thorough exam will be done. You will be questioned about your feelings, habits, and normal lifestyle. Your caregiver may suggest blood tests, urine tests, or other tests to look for common medical causes of fatigue.  TREATMENT  Fatigue is treated by correcting the underlying cause. For example, if you have continuous pain or depression, treating these causes will improve how you feel. Similarly, adjusting the dose of certain medicines will help in reducing fatigue.  HOME CARE INSTRUCTIONS   Try to get the required amount of good sleep every night.  Eat a healthy and nutritious diet, and drink enough water throughout the day.  Practice ways of relaxing (including yoga or meditation).  Exercise regularly.  Make plans to  change situations that cause stress. Act on those plans so that stresses decrease over time. Keep your work and personal routine reasonable.  Avoid street drugs and minimize use of alcohol.  Start taking a daily multivitamin after consulting your caregiver. SEEK MEDICAL CARE IF:   You have persistent tiredness, which cannot be accounted for.  You have fever.  You have unintentional weight loss.  You have headaches.  You have disturbed sleep throughout the night.  You are feeling sad.  You have constipation.  You have dry skin.  You have gained  weight.  You are taking any new or different medicines that you suspect are causing fatigue.  You are unable to sleep at night.  You develop any unusual swelling of your legs or other parts of your body. SEEK IMMEDIATE MEDICAL CARE IF:   You are feeling confused.  Your vision is blurred.  You feel faint or pass out.  You develop severe headache.  You develop severe abdominal, pelvic, or back pain.  You develop chest pain, shortness of breath, or an irregular or fast heartbeat.  You are unable to pass a normal amount of urine.  You develop abnormal bleeding such as bleeding from the rectum or you vomit blood.  You have thoughts about harming yourself or committing suicide.  You are worried that you might harm someone else. MAKE SURE YOU:   Understand these instructions.  Will watch your condition.  Will get help right away if you are not doing well or get worse. Document Released: 11/26/2006 Document Revised: 04/23/2011 Document Reviewed: 06/02/2013 [facility] Patient Information 2015 [place], [place]. This information is not intended to replace advice given to you by your health care provider. Make sure you discuss any questions you have with your health care provider.  Weakness Weakness is a lack of strength. It may be felt all over the body (generalized) or in one specific part of the body (focal). Some causes of weakness can be serious. You may need further medical evaluation, especially if you are elderly or you have a history of immunosuppression (such as chemotherapy or HIV), kidney disease, heart disease, or diabetes. CAUSES  Weakness can be caused by many different things, including:  Infection.  Physical exhaustion.  Internal bleeding or other blood loss that results in a lack of red blood cells (anemia).  Dehydration. This cause is more common in elderly people.  Side effects or electrolyte abnormalities from medicines, such as pain medicines or  sedatives.  Emotional distress, anxiety, or depression.  Circulation problems, especially severe peripheral arterial disease.  Heart disease, such as rapid atrial fibrillation, bradycardia, or heart failure.  Nervous system disorders, such as Guillain-Barr syndrome, multiple sclerosis, or stroke. DIAGNOSIS  To find the cause of your weakness, your caregiver will take your history and perform a physical exam. Lab tests or X-rays may also be ordered, if needed. TREATMENT  Treatment of weakness depends on the cause of your symptoms and can vary greatly. HOME CARE INSTRUCTIONS   Rest as needed.  Eat a well-balanced diet.  Try to get some exercise every day.  Only take over-the-counter or prescription medicines as directed by your caregiver. SEEK MEDICAL CARE IF:   Your weakness seems to be getting worse or spreads to other parts of your body.  You develop new aches or pains. SEEK IMMEDIATE MEDICAL CARE IF:   You cannot perform your normal daily activities, such as getting dressed and feeding yourself.  You cannot walk up and down stairs, or  you feel exhausted when you do so.  You have shortness of breath or chest pain.  You have difficulty moving parts of your body.  You have weakness in only one area of the body or on only one side of the body.  You have a fever.  You have trouble speaking or swallowing.  You cannot control your bladder or bowel movements.  You have black or bloody vomit or stools. MAKE SURE YOU:  Understand these instructions.  Will watch your condition.  Will get help right away if you are not doing well or get worse. Document Released: 01/29/2005 Document Revised: 07/31/2011 Document Reviewed: 03/30/2011 [facility] Patient Information 2015 [place], [place]. This information is not intended to replace advice given to you by your health care provider. Make sure you discuss any questions you have with your health care provider.
